# Patient Record
Sex: Male | Born: 1935 | Race: White | Hispanic: No | Marital: Single
Health system: Southern US, Community
[De-identification: ages and names within clinical notes are randomized; demographics above are authoritative.]

---

## 2013-04-02 ENCOUNTER — Inpatient Hospital Stay: Payer: Self-pay | Admitting: Internal Medicine

## 2013-04-02 ENCOUNTER — Ambulatory Visit: Payer: Self-pay | Admitting: Family Medicine

## 2013-04-02 LAB — COMPREHENSIVE METABOLIC PANEL
Albumin: 3.7 g/dL (ref 3.4–5.0)
Alkaline Phosphatase: 93 U/L (ref 50–136)
Anion Gap: 4 — ABNORMAL LOW (ref 7–16)
BUN: 18 mg/dL (ref 7–18)
Calcium, Total: 9.3 mg/dL (ref 8.5–10.1)
Chloride: 105 mmol/L (ref 98–107)
Co2: 29 mmol/L (ref 21–32)
Creatinine: 0.94 mg/dL (ref 0.60–1.30)
EGFR (African American): >60
EGFR (Non-African Amer.): >60
Osmolality: 278 (ref 275–301)
SGPT (ALT): 24 U/L (ref 12–78)

## 2013-04-02 LAB — CBC
HGB: 13.6 g/dL (ref 13.0–18.0)
MCH: 27.9 pg (ref 26.0–34.0)
MCHC: 33.2 g/dL (ref 32.0–36.0)
MCV: 84 fL (ref 80–100)
RDW: 14.9 % — ABNORMAL HIGH (ref 11.5–14.5)
WBC: 7.6 10*3/uL (ref 3.8–10.6)

## 2013-04-02 LAB — LIPID PANEL: HDL Cholesterol: 54 mg/dL (ref 40–60)

## 2013-04-02 LAB — TROPONIN I: Troponin-I: <0.02 ng/mL

## 2013-04-02 LAB — URINALYSIS, COMPLETE
Bacteria: NONE SEEN
Leukocyte Esterase: NEGATIVE
Nitrite: NEGATIVE
Squamous Epithelial: NONE SEEN
WBC UR: 1 /HPF (ref 0–5)

## 2013-04-03 LAB — CBC WITH DIFFERENTIAL/PLATELET
Basophil #: 0.1 10*3/uL (ref 0.0–0.1)
Basophil %: 1.2 %
Eosinophil #: 0 10*3/uL (ref 0.0–0.7)
Eosinophil %: 0.5 %
HCT: 35.8 % — ABNORMAL LOW (ref 40.0–52.0)
HGB: 12.3 g/dL — ABNORMAL LOW (ref 13.0–18.0)
Lymphocyte #: 0.6 10*3/uL — ABNORMAL LOW (ref 1.0–3.6)
MCHC: 34.2 g/dL (ref 32.0–36.0)
MCV: 83 fL (ref 80–100)
Monocyte #: 0.8 x10 3/mm (ref 0.2–1.0)
RDW: 14.6 % — ABNORMAL HIGH (ref 11.5–14.5)
WBC: 5.8 10*3/uL (ref 3.8–10.6)

## 2013-04-03 LAB — BASIC METABOLIC PANEL
Anion Gap: 4 — ABNORMAL LOW (ref 7–16)
BUN: 15 mg/dL (ref 7–18)
Calcium, Total: 8.6 mg/dL (ref 8.5–10.1)
Creatinine: 0.91 mg/dL (ref 0.60–1.30)
EGFR (African American): >60
EGFR (Non-African Amer.): >60
Glucose: 93 mg/dL (ref 65–99)
Osmolality: 280 (ref 275–301)
Potassium: 3.9 mmol/L (ref 3.5–5.1)
Sodium: 140 mmol/L (ref 136–145)

## 2013-04-04 LAB — URINE CULTURE

## 2013-04-05 LAB — CBC WITH DIFFERENTIAL/PLATELET
Basophil #: 0 10*3/uL (ref 0.0–0.1)
HCT: 36.8 % — ABNORMAL LOW (ref 40.0–52.0)
HGB: 12.7 g/dL — ABNORMAL LOW (ref 13.0–18.0)
MCH: 28.5 pg (ref 26.0–34.0)
MCHC: 34.4 g/dL (ref 32.0–36.0)
MCV: 83 fL (ref 80–100)
Neutrophil #: 3.4 10*3/uL (ref 1.4–6.5)
Platelet: 76 10*3/uL — ABNORMAL LOW (ref 150–440)
RDW: 14.8 % — ABNORMAL HIGH (ref 11.5–14.5)

## 2013-04-06 LAB — BASIC METABOLIC PANEL
Anion Gap: 4 — ABNORMAL LOW (ref 7–16)
Chloride: 108 mmol/L — ABNORMAL HIGH (ref 98–107)
Creatinine: 0.86 mg/dL (ref 0.60–1.30)
EGFR (African American): >60
EGFR (Non-African Amer.): >60
Glucose: 102 mg/dL — ABNORMAL HIGH (ref 65–99)
Potassium: 4.1 mmol/L (ref 3.5–5.1)

## 2013-04-06 LAB — PLATELET COUNT: Platelet: 103 10*3/uL — ABNORMAL LOW (ref 150–440)

## 2013-04-08 LAB — CULTURE, BLOOD (SINGLE)

## 2013-10-22 ENCOUNTER — Emergency Department: Payer: Self-pay | Admitting: Emergency Medicine

## 2013-10-22 LAB — CBC
HCT: 33.5 % — ABNORMAL LOW (ref 40.0–52.0)
HGB: 10.8 g/dL — ABNORMAL LOW (ref 13.0–18.0)
MCHC: 32.3 g/dL (ref 32.0–36.0)
WBC: 4.6 10*3/uL (ref 3.8–10.6)

## 2013-10-22 LAB — URINALYSIS, COMPLETE
Leukocyte Esterase: NEGATIVE
Nitrite: NEGATIVE
Ph: 5 (ref 4.5–8.0)
Protein: NEGATIVE
RBC,UR: 1 /HPF (ref 0–5)
Specific Gravity: 1.021 (ref 1.003–1.030)
Squamous Epithelial: NONE SEEN
WBC UR: <1 /HPF (ref 0–5)

## 2013-10-22 LAB — COMPREHENSIVE METABOLIC PANEL
Albumin: 2.8 g/dL — ABNORMAL LOW (ref 3.4–5.0)
Alkaline Phosphatase: 75 U/L (ref 50–136)
Anion Gap: 1 — ABNORMAL LOW (ref 7–16)
Bilirubin,Total: 0.5 mg/dL (ref 0.2–1.0)
Chloride: 108 mmol/L — ABNORMAL HIGH (ref 98–107)
Co2: 29 mmol/L (ref 21–32)
Creatinine: 1.38 mg/dL — ABNORMAL HIGH (ref 0.60–1.30)
EGFR (African American): 51 — ABNORMAL LOW
SGOT(AST): 27 U/L (ref 15–37)
Sodium: 138 mmol/L (ref 136–145)

## 2013-10-22 LAB — LIPASE, BLOOD: Lipase: 166 U/L (ref 73–393)

## 2014-07-12 IMAGING — CT CT HEAD WITHOUT CONTRAST
1 series · 16 of 30 positions shown, 20 images · non-contrast
Comparison: none

REASON FOR EXAM: altered mental status
COMMENTS:

PROCEDURE:     CT  - CT HEAD WITHOUT CONTRAST  - April 02, 2013  [DATE]
RESULT:     History: Altered mental status.

[Series 2: soft tissue · axial · 0.42mm/px · z∈[-175,-30]mm · 16 of 33 slices shown, 20 images]
[im 2/33  brain]
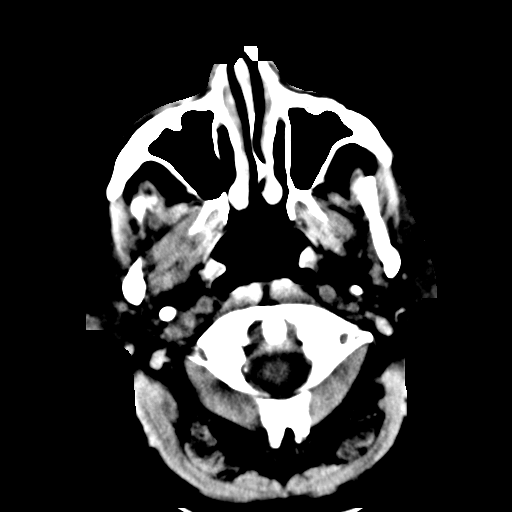
[im 2/33  bone]
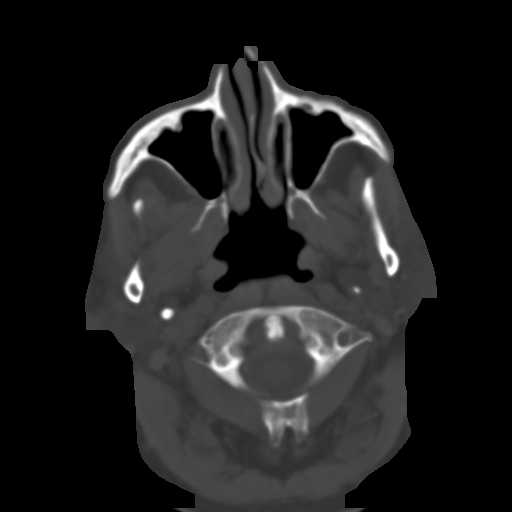
[im 4/33  brain]
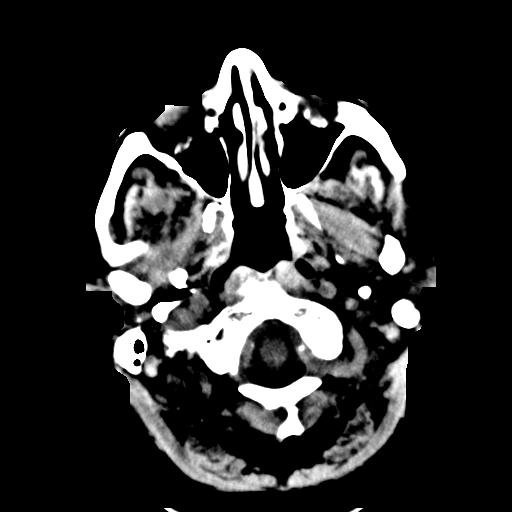
[im 6/33  brain]
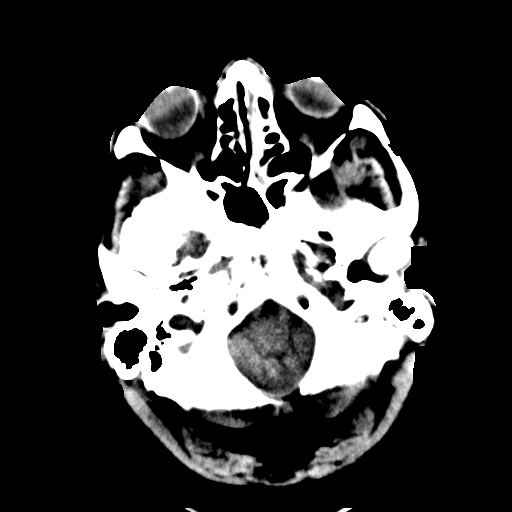
[im 8/33  brain]
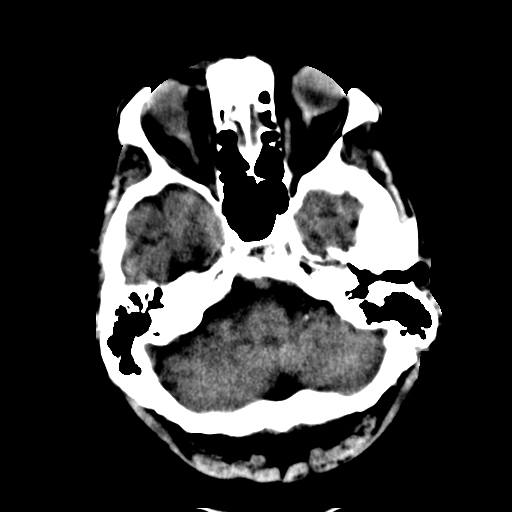
[im 9/33  brain]
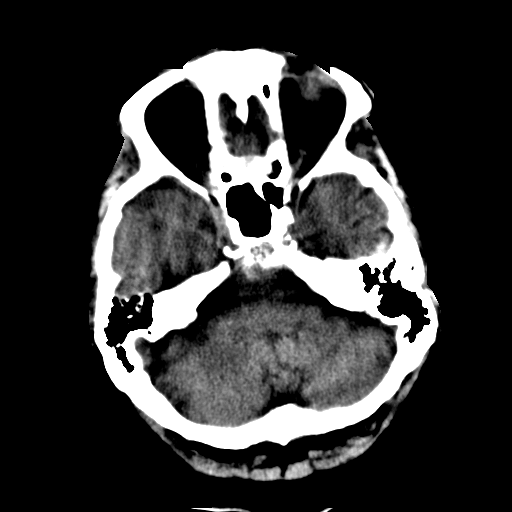
[im 9/33  bone]
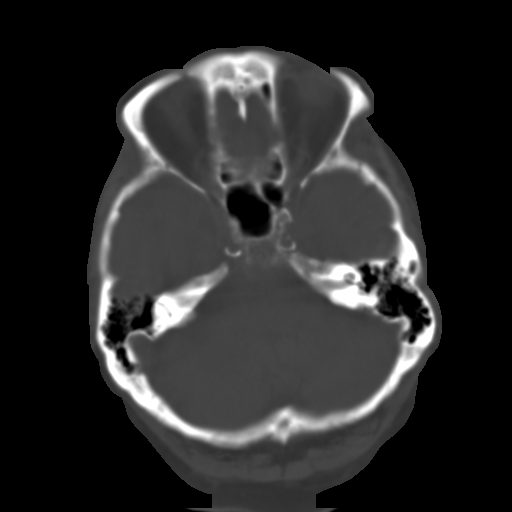
[im 12/33  brain]
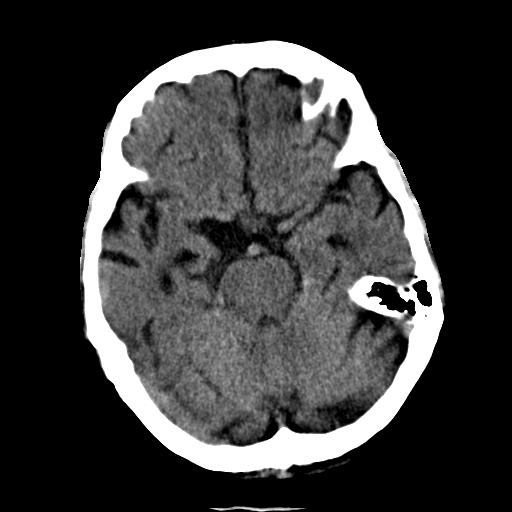
[im 14/33  brain]
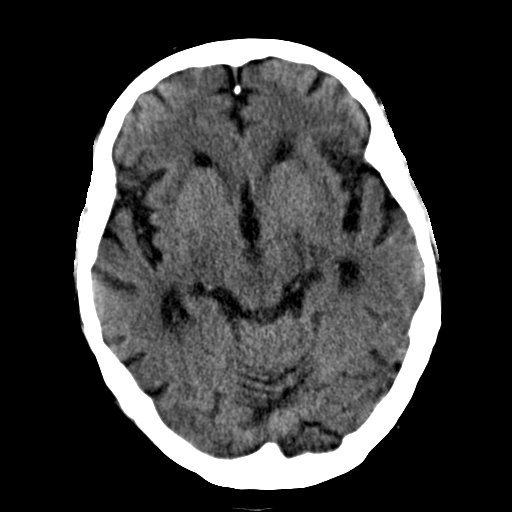
[im 16/33  brain]
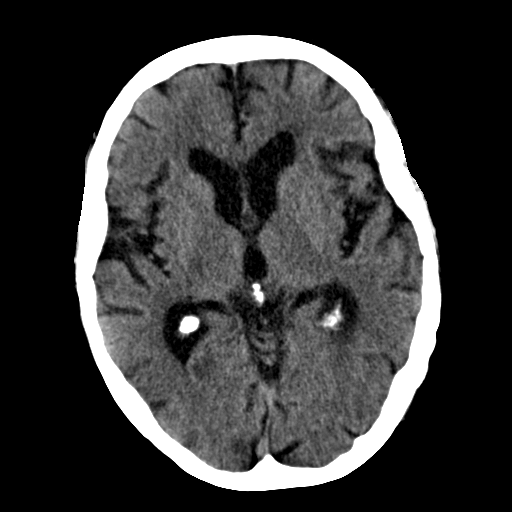
[im 17/33  brain]
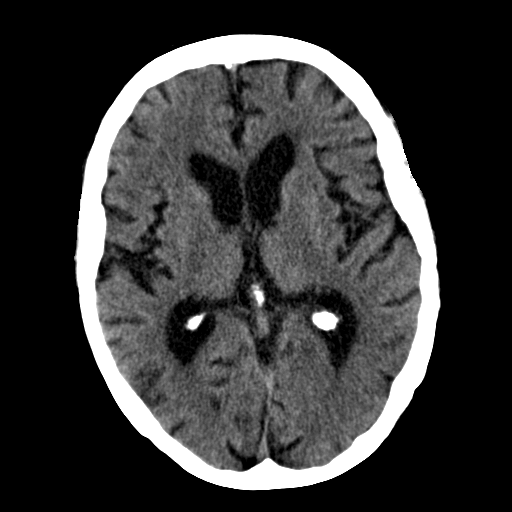
[im 17/33  bone]
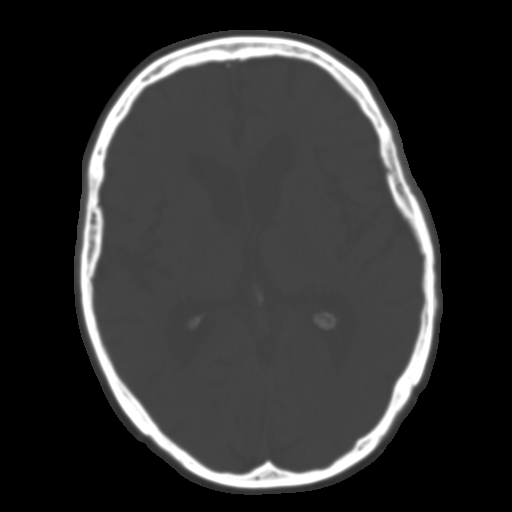
[im 19/33  brain]
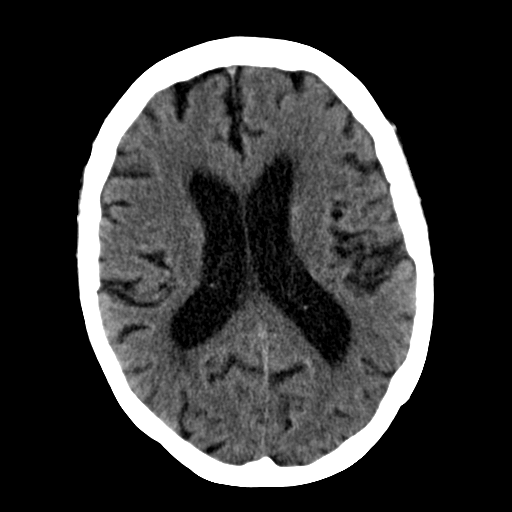
[im 21/33  brain]
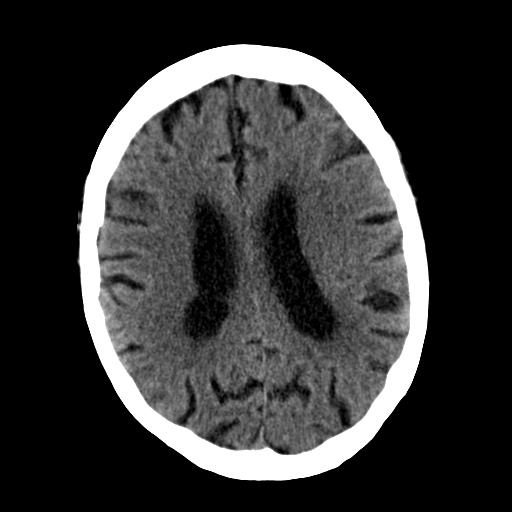
[im 24/33  brain]
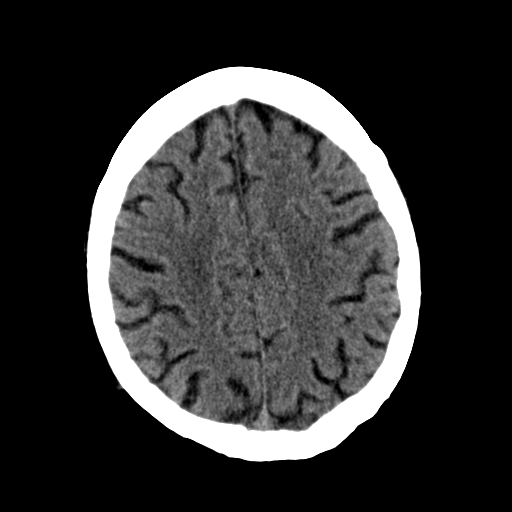
[im 25/33  brain]
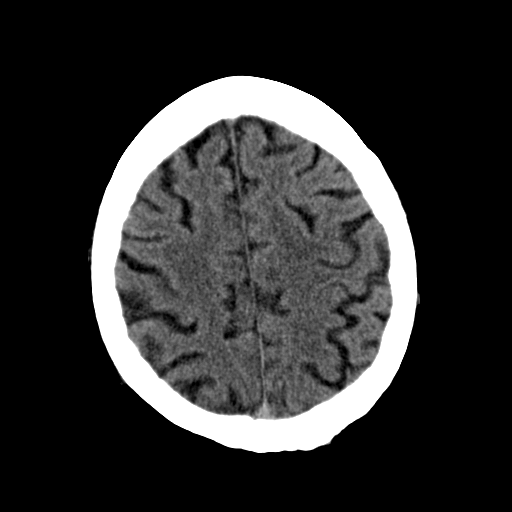
[im 25/33  bone]
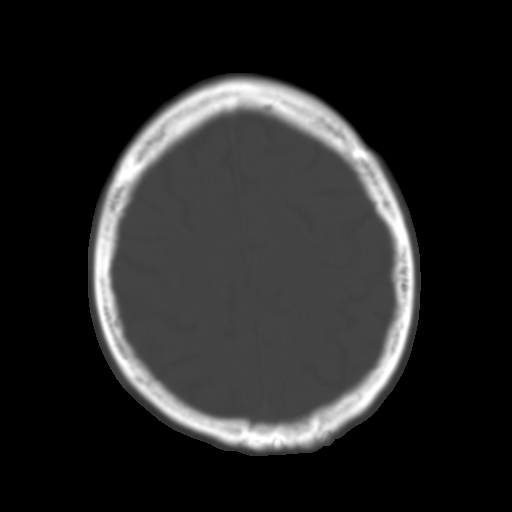
[im 27/33  brain]
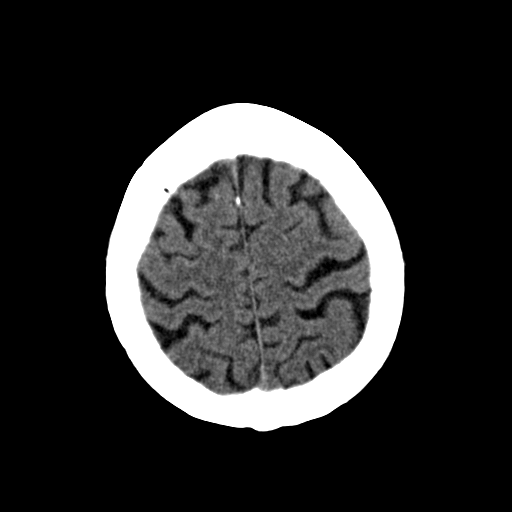
[im 29/33  brain]
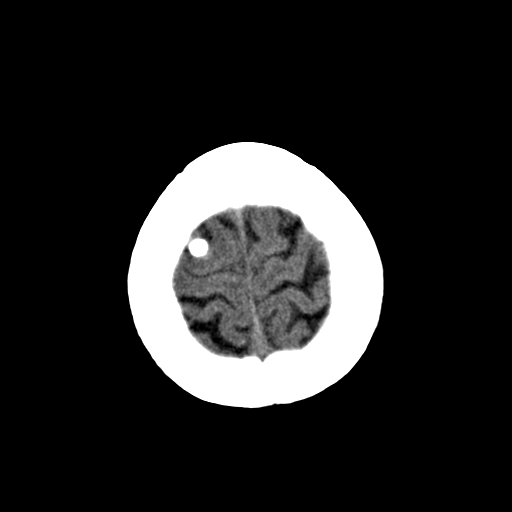
[im 31/33  brain]
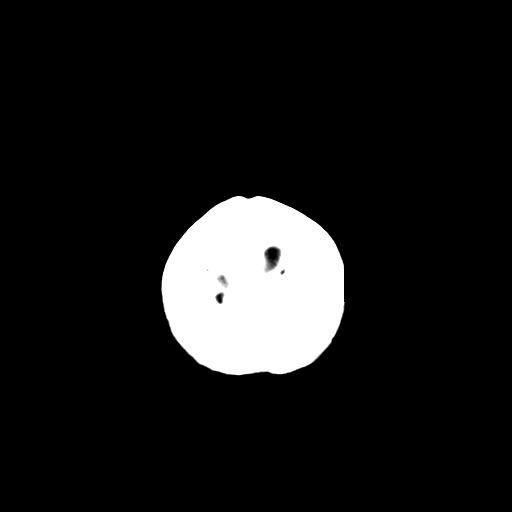

[16 of 30 positions shown; findings below may reference images not displayed]

FINDINGS: No significant mass or hydrocephalus. White matter changes
consistent chronic ischemia. Rounded calcific density in the right frontal
regional along the dural surface, most likely a meningioma. No significant
mass effect. No acute bony abnormality.

Mild mucosal thickening of ethmoid sinuses.
IMPRESSION: No acute abnormality.

## 2014-07-12 IMAGING — CR DG CHEST 1V PORT
1 series · 1 of 1 positions shown · non-contrast
Comparison: none

REASON FOR EXAM: sepsis
COMMENTS:

PROCEDURE:     DXR - DXR PORTABLE CHEST SINGLE VIEW  - April 02, 2013  [DATE]
RESULT:     No prior studies available for comparison Mediastinal and hilar
structures are normal. Atelectasis versus infiltrates in the lung bases.
Cardiomegaly. No pulmonary venous congestion.

[ap]
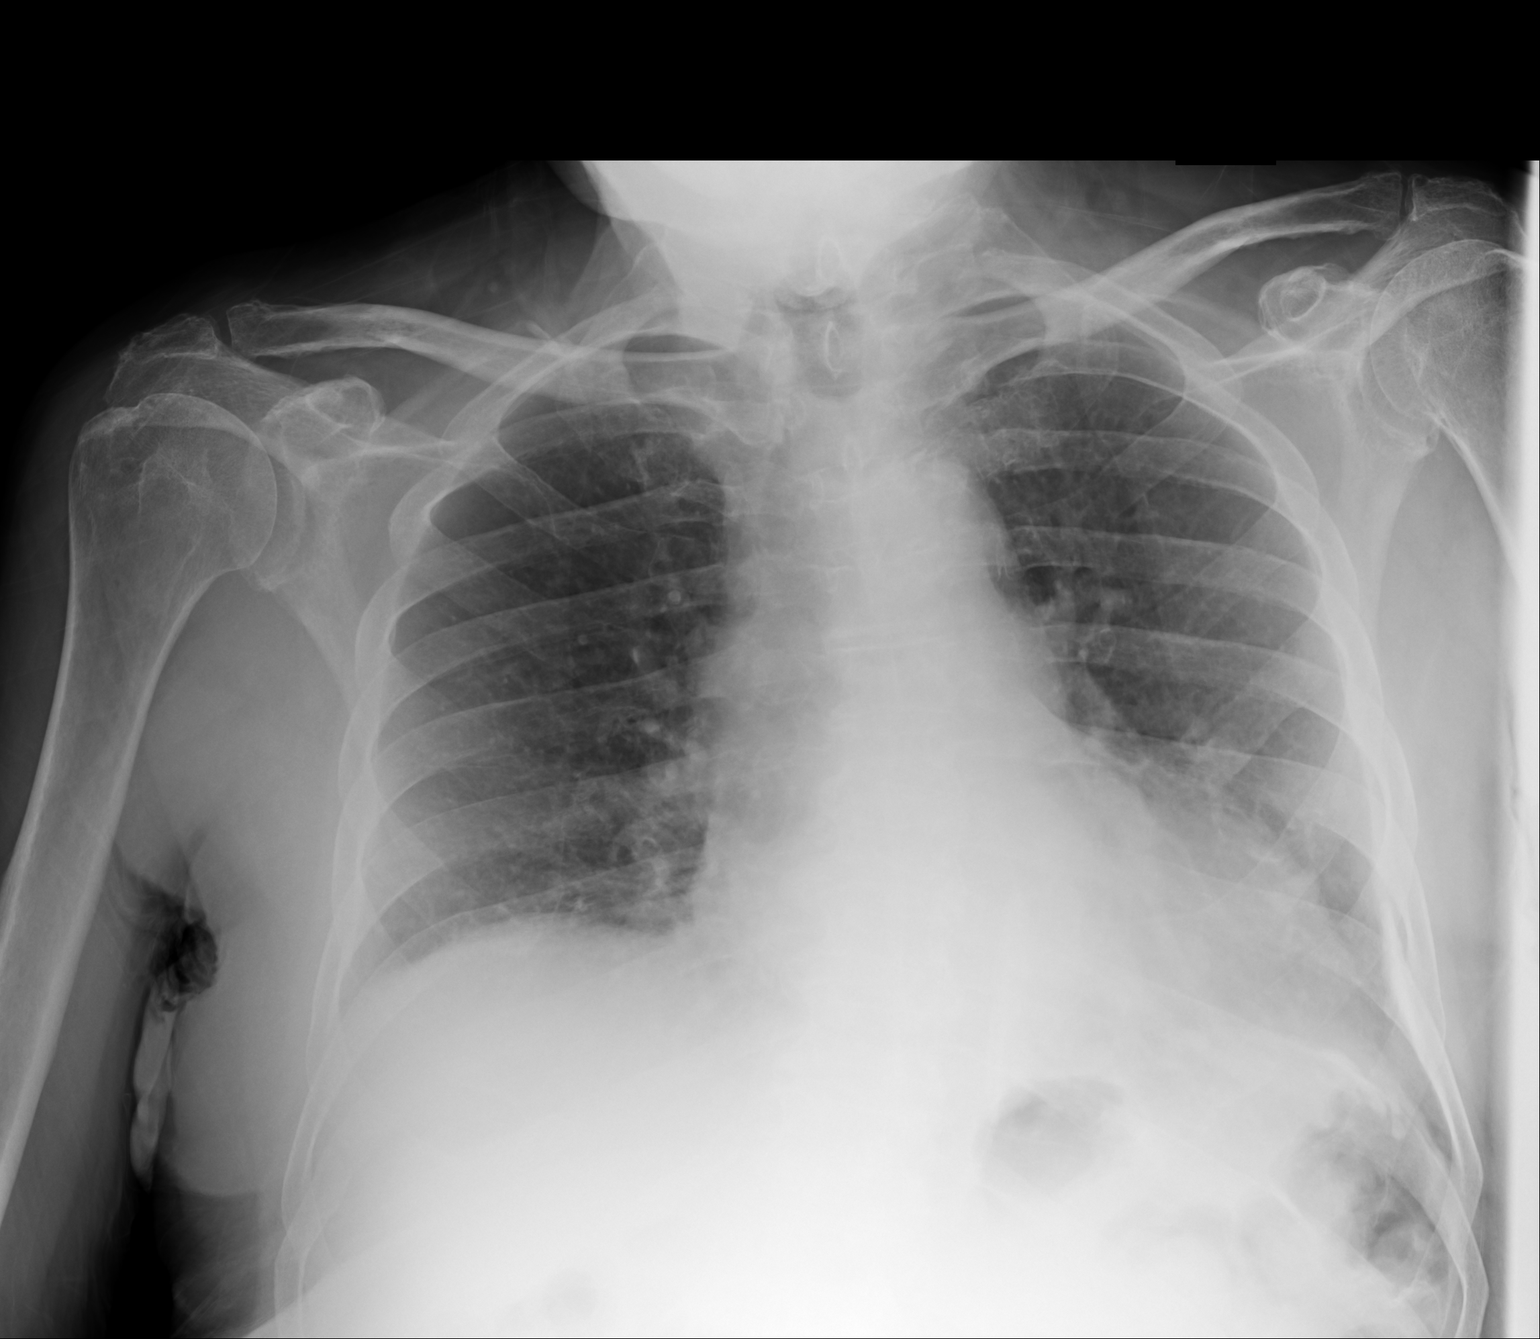

[1 of 1 positions shown; findings below may reference images not displayed]

IMPRESSION: Mild atelectasis versus infiltrates lung bases.

## 2015-03-24 NOTE — Discharge Summary (Signed)
PATIENT NAME:  Jesse Hubbard, Jesse Hubbard MR#:  [id number] DATE OF BIRTH:  1919/02/21  DATE OF ADMISSION:  04/02/2013 DATE OF DISCHARGE:  04/06/2013  NOTE: 04/06/2013 is preliminary discharge date.  ADMITTING DIAGNOSIS: Pneumonia.   DISCHARGE DIAGNOSES:  1. Mild hypoxia. 2.  Multifocal pneumonia; questionable aspiration pneumonitis.  3.  Dysphagia.  4. Slurred speech, confusion; suspected Alzheimer's dementia as well as suspected chronic cerebrovascular accident, with right-sided weakness per daughter's report.  5. History of falls, ambulatory dysfunction.  6.  History of melanoma as well as benign prostatic hypertrophy.   DISCHARGE CONDITION:  Guarded.   DISCHARGE MEDICATIONS: The patient is to continue: Acetaminophen 325 mg, 2 tablets every 4 hours as needed, aspirin 81 mg p.o. daily, senna 1 tablet twice daily as needed, docusate sodium 100 mg p.o. twice daily as needed, Alphagan lubricant, 1 drop to each affected eye every 2 hours as needed, Levaquin 750 mg p.o. every 48 hours for 3 more tablets.   HOME OXYGEN: None.   DIET:  1.  Two grams salt, low-fat, low-cholesterol pureed diet, which is dysphagia diet.  2.  Nectar-thick liquids.   ACTIVITY LIMITATIONS: As tolerated.  Referral to physical therapy 2 to 7 times a week, and follow up with Dr. Quillian Quince in 2 days after discharge.   CONSULTANTS: Care management, physical therapy.   RADIOLOGIC STUDIES: Chest x-ray, portable single-view, 04/02/2013 showed mild atelectasis versus infiltrates at lung bases.   CT scan of head without contrast, 05/02 2014 showed no acute abnormality.   Facial bones, limited study 04/03/2013 showed no metallic foreign bodies.   MRI of brain without contrast, 04/03/2013 showed no evidence of acute ischemia. Diffuse atrophy, chronic white matter ischemia were also noted.   Carotid ultrasound 04/03/2013 revealed no hemodynamically-significant stenosis.   Echocardiogram, 04/04/2013 showed a left ventricular ejection  fraction by visual estimation of 60% to 65%. Normal global left ventricular systolic function, mild to moderate valve regurgitation, mildly increased left ventricular posterior wall thickness, mild aortic valve regurgitation was also noted.   CT scan of chest for pulmonary embolism with IV contrast because of mild hypoxia, 04/04/2013 revealed increased density in the left lower lobe and to a lesser extent on the right; may reflect atelectasis or pneumonia. There was no significant pleural fluid collection. There were calcified pleural plaques bilaterally, consistent with previous asbestos exposure. The cardiac chambers are enlarged. There was no overt evidence of CHF. There were a few borderline enlargements of mediastinal lymph nodes.   The patient is 81 year old Caucasian male with a past medical history significant for a history of BPH, history of melanoma in the past who presents to the hospital with generalized weakness as well as slurring of speech. Please refer to Dr. Prudencio Pair admission on 04/02/2013.   On arrival to the hospital, the patient's family told the physician that the patient was noted to have some right-sided weakness, which was new, as well as some slurring of speech. He was also coughing but not producing much phlegm. His temperature was 99.5 on arrival to the Emergency Room, but it was high as 101 at Urgent Care where the patient initially went. His pulse was 80, respirations were 16, blood pressure 144/72, saturation was 92% on oxygen therapy.   Physical exam was unremarkable. The patient did have some rhonchi, bibasilar, but no wheezing and no crackles, and no accessory muscle use.   The patient was noted to have generalized weakness. His lab data done in the Emergency Room on 04/02/2013 showed mild elevation of glucose  to 113, otherwise BMP was unremarkable. The patient's liver enzymes were normal. Troponin was less than 0.02. White blood cell count was normal at 7.6, hemoglobin  was 13.6, platelet count 118.   Blood cultures taken on the same day, 04/02/2013 showed no growth. Urinalysis revealed yellow-clear urine, negative for glucose, bilirubin or ketones. Specific gravity was 1.014, pH was 6.0, 2+ blood, negative for protein, nitrites or leukocyte esterase, 10 red blood cells, 1 white blood cell, no bacteria or epithelial cells were noted.   Urine cultures grew out coagulase-negative Staphylococcus, 10,000 colony-forming units during in-and-out catheterization report. The patient's lactic acid level was 1.0.   ABGs were done on 04/03/2013, revealed mild hypoxemia with PO2 level of 67 on room air.   EKG showed normal sinus rhythm at 80 beats per minute, normal axis. No ST-T changes were noted.   Chest x-ray revealed atelectasis at lung bases concerning for possible pneumonia.   The patient was admitted to the hospital. He was started on IV antibiotic, Levaquin, as well as oxygen. He however refused to carry oxygen as nasal cannulas. His oxygenation was satisfactory at 93% to 94% on room air at rest, so we did not press him to have oxygen necessarily  continuously.   He was also evaluated by a speech therapist as outpatient. The patient would benefit from degrading his diet to dysphagia I diet with nectar-thick liquids. With this, patient did quite well. Did not complain of any significant discomfort. He will continue to have an intermittent cough, however since there was no sputum production we repeated his chest x-ray today on the day of discharge, 04/06/2013, however those results are still pending at the time of dictation. If results are within normal limits or improving the patient will likely be discharged to a skilled nursing facility for management of his chronic medical problems. He is to continue antibiotic therapy for the next 3 doses, to complete a Levaquin course.   In regards to hypoxia, as mentioned above the patient refused nasal cannula oxygen and his  oxygenation remains low-stable while in the hospital.   On the day of discharge, however, on 04/06/2013 the patient's oxygen saturation are much better at 96% on room air at rest, after 97 on room air at rest.   As opposed to admission, O2 sats were 94% to 96%, signifying some improvement of his oxygenation.   Regarding slurring of speech and confusion, the patient had MRI of his brain done which showed no acute stroke, however due to patient's daughter's report about right-sided weakness we were concerned the patient very likely had a chronic CVA  some time ago. The patient was evaluated by a physical therapist who recommended skilled nursing facility placement.   For BPH, the patient is to continue followup by his primary care physician.   For melanoma he is to followup outpatient for melanoma excision from his back, as previously scheduled.   The patient is being discharged to skilled nursing facility with the above-mentioned medications and follow-up.   His vitals: Temperature is 98.4, pulse ranging from 60 to 100, respiration rate was 18 to 20, blood pressure ranging from 140 to 156 systolic and 80s to 90s diastolic, O2 sats 96% on room air at rest.   Time spent: Forty minutes.   ____________________________ Katharina Caperima Debbrah Sampedro, MD rv:dm D: 04/06/2013 11:41:02 ET T: 04/06/2013 12:29:59 ET JOB#: [id number]  cc: Katharina Caperima Cindee Mclester, MD, <Dictator> Burley SaverL. Katherine Bliss, MD Chynah Orihuela Winona LegatoVAICKUTE MD ELECTRONICALLY SIGNED 04/15/2013 18:14

## 2015-03-24 NOTE — H&P (Signed)
PATIENT NAME:  Jesse Hubbard, Jesse Hubbard MR#:  [id number] DATE OF BIRTH:  07/24/1919  DATE OF ADMISSION:  04/02/2013  ADMITTING PHYSICIAN:  Radhika Kalisetti.  PRIMARY CARE PHYSICIAN:  Dr. Katherine Bliss.  CHIEF COMPLAINT:  Generalized weakness and slurred speech.   HISTORY OF PRESENT ILLNESS:  The patient is a 81-year-old elderly Caucasian male with past medical history significant for benign prostatic hypertrophy, who lives at an independent living facility at [facility], was brought in with the above-mentioned complaints. The patient is extensively weak and his speech is very unclear at this time. Most of the history is obtained by daughter at bedside. According to the daughter, they  have noticed some decline in his overall function lately over the past couple of months, but he has been still doing okay. He does not have any diagnosis of dementia. He is conversational, pretty good with his memory, activities in past year. The patient's wife is at the dementia unit at [facility]. They last spoke with him 2 days ago. He sounded fine. This morning, they went to see him, and he had a fever of 100 degrees Fahrenheit, and also his speech was slurred, and they felt his left side was extremely weak on exam, and then they sat him in a chair outside the facility, then he could not even get up by himself, which is unusual for him.  At baseline, he uses a walker but still unsteady on the feet, getting worse lately, and had recent 1 to 2 falls. He is also noticed to have a cough after he came to the ED. The patient usually does not complain of anything, so the family does not know how long he had the cough for.   PAST MEDICAL HISTORY: 1.  Melanoma.  2.  Mild benign prostatic hypertrophy.   PAST SURGICAL HISTORY:  Resection of melanoma on his right temporal region, and there is 1 more melanoma on the back, which is scheduled to be dissected for next week.   ALLERGIES TO MEDICATIONS: No  known drug allergies.   CURRENT HOME MEDICATIONS:  None.  SOCIAL HISTORY:  Has been a resident at the [facility] in the independent part.  No smoking or alcohol use.   FAMILY HISTORY:  Not significant.   REVIEW OF SYSTEMS:  Very difficult to be obtained from the patient, but as far as obtained: CONSTITUTIONAL:  He does have fever. He has generalized weakness and fatigue.  EYES:  No blurred vision, double vision, inflammation or glaucoma.  ENT:  He is hard of hearing. No tinnitus, ear pain or discharge.  RESPIRATORY:  Positive for cough. No wheeze or COPD.  CARDIOVASCULAR:  Denies any chest pain, orthopnea, edema, palpitations or syncope.  GASTROINTESTINAL:  No nausea, vomiting, diarrhea, abdominal pain, hematemesis or melena.  GENITOURINARY:  Positive for hesitancy and also incontinence. No hematuria or dysuria.  ENDOCRINE:  No polyuria, nocturia, thyroid problems, heat or cold intolerance.  HEMATOLOGY:  No anemia, easy bruising or bleeding.  SKIN:  No acne, rash or lesions.  MUSCULOSKELETAL:  No neck, back, shoulder pain, arthritis or gout.  NEUROLOGIC:  No prior CVA, TIA or seizures. Positive for ataxia lately.  PSYCHOLOGICAL:  No anxiety, insomnia, depression.   PHYSICAL EXAMINATION: VITAL SIGNS:  Temperature 99.5 degrees Fahrenheit, but his temperature was elevated to 101 Fahrenheit at the urgent care initially that he went to.  Pulse 80, respirations 16, blood pressure 144/72, pulse ox 92% on room air.  GENERAL:     Well-built, well-nourished, fragile-appearing male, lying in bed, not in any acute distress.  HEENT:  Normocephalic, atraumatic. Pupils equal, round, reacting to light. Anicteric sclerae. Extraocular movements intact. Oropharynx clear without erythema, mass or exudates.  NECK:  Supple. No thyromegaly, JVD or carotid bruits. No lymphadenopathy.  LUNGS: Moving air bilaterally. Rhonchi bibasilar. No crackles. No use of accessory muscles for breathing. No  wheezing.  CARDIOVASCULAR:  S1, S2 regular rate and rhythm. a 3/6 systolic murmur present. No rubs or gallops.  ABDOMEN:  Soft, nontender, nondistended. No hepatosplenomegaly. Normal bowel sounds.  EXTREMITIES: No pedal edema. No clubbing or cyanosis, 2+ dorsalis pedis pulses palpable bilaterally.  SKIN:  No acne, rash or lesions.  LYMPHATICS:  No cervical lymphadenopathy.  NEUROLOGIC:  Cranial nerves seem to be intact, symmetrical. Face:  No facial droop noted. His speech is slow and not understandable because he seems very soft spoken at this time. No word finding deficit observed. His strength is 4/4 in all 4 extremities, however generalized weakness noted. HE patient  PSYCHOLOGICAL:  The patient is awake, alert and oriented x 2.   IMAGING, DIAGNOSTICS AND LABORATORY DATA: WBC 7.6, hemoglobin 13.6, hematocrit 41.1, platelet count 118.   Sodium 138, potassium 4.3, chloride 105, bicarb 29, BUN 18, creatinine 0.94, glucose 113, calcium of 9.3.   ALT 24, AST 31, alk phos 93, total bili 0.5, albumin 3.7. Troponin less than 0.02. Chest x-ray showing bibasilar infiltrates. Urinalysis: Negative for any infection. CT of the head showing no acute intracranial abnormality. Rounded calcific density in the right frontal region along the dural surface, likely representing a hemangioma.   EKG showing normal sinus rhythm, heart rate of 80.   ASSESSMENT AND PLAN:  A 81-year-old male with past medical history of benign prostate hypertrophy, who is coming from an independent living facility, brought in for slurred speech, temporary confusion and fever of 101. UA is normal. Chest x-ray with bibasilar infiltrates.  1.  Pneumonia. Blood cultures drawn in the ED. Started on Levaquin. Continue to monitor at this time. Has cough.  2.  Slurred speech. Family worried about early dementia and also concerned about a possible transient ischemic attack. We will monitor with neuro checks.  Get MRI of the brain, carotid  Dopplers and also echo. Start an aspirin and lipid profile check prior to starting statin at this time. Also, the patient has a small hemangioma as noted on the CT scan. That can be followed up on too.  3.  Benign prostate hypertrophy, was not taking any medications at home. No urinary retention at this time. He is incontinent at baseline, so continue to monitor.  4.  Physical therapy evaluation and care management consults, as the patient might need higher level of care rather than independent living facility, as he had 2 to 3 falls lately in the last 2 months.    CODE STATUS: Full code.   TIME SPENT ON ADMISSION: 50 minutes.     ____________________________ Radhika Kalisetti, MD rk:dmm D: 04/02/2013 21:00:56 ET T: 04/02/2013 21:39:37 ET JOB#: [id number]  cc: Radhika Kalisetti, MD, <Dictator> L. Katherine Bliss, MD RADHIKA KALISETTI MD ELECTRONICALLY SIGNED 04/23/2013 13:01
